# Patient Record
Sex: Female | Born: 1955 | Race: White | Hispanic: No | Marital: Married | Smoking: Never smoker
Health system: Southern US, Community
[De-identification: ages and names within clinical notes are randomized; demographics above are authoritative.]

## PROBLEM LIST (undated history)

## (undated) DIAGNOSIS — K449 Diaphragmatic hernia without obstruction or gangrene: Secondary | ICD-10-CM

## (undated) DIAGNOSIS — I10 Essential (primary) hypertension: Secondary | ICD-10-CM

## (undated) HISTORY — PX: REPLACEMENT TOTAL HIP W/  RESURFACING IMPLANTS: SUR1222

---

## 2009-07-20 ENCOUNTER — Ambulatory Visit (HOSPITAL_COMMUNITY): Admission: RE | Admit: 2009-07-20 | Discharge: 2009-07-20 | Payer: Self-pay | Admitting: Family Medicine

## 2009-08-12 ENCOUNTER — Ambulatory Visit (HOSPITAL_COMMUNITY): Admission: RE | Admit: 2009-08-12 | Discharge: 2009-08-12 | Payer: Self-pay | Admitting: Family Medicine

## 2010-01-26 ENCOUNTER — Ambulatory Visit (HOSPITAL_COMMUNITY): Admission: RE | Admit: 2010-01-26 | Discharge: 2010-01-26 | Payer: Self-pay | Admitting: Family Medicine

## 2010-07-27 ENCOUNTER — Ambulatory Visit (HOSPITAL_COMMUNITY)
Admission: RE | Admit: 2010-07-27 | Discharge: 2010-07-27 | Payer: Self-pay | Source: Home / Self Care | Attending: Family Medicine | Admitting: Family Medicine

## 2011-07-10 ENCOUNTER — Other Ambulatory Visit (HOSPITAL_COMMUNITY): Payer: Self-pay | Admitting: Family Medicine

## 2011-07-10 DIAGNOSIS — Z139 Encounter for screening, unspecified: Secondary | ICD-10-CM

## 2011-07-31 ENCOUNTER — Ambulatory Visit (HOSPITAL_COMMUNITY)
Admission: RE | Admit: 2011-07-31 | Discharge: 2011-07-31 | Disposition: A | Discharge status: Home / Self Care | Payer: 59 | Source: Ambulatory Visit | Attending: Family Medicine | Admitting: Family Medicine

## 2011-07-31 DIAGNOSIS — Z139 Encounter for screening, unspecified: Secondary | ICD-10-CM

## 2011-07-31 DIAGNOSIS — Z1231 Encounter for screening mammogram for malignant neoplasm of breast: Secondary | ICD-10-CM | POA: Insufficient documentation

## 2012-07-04 ENCOUNTER — Other Ambulatory Visit (HOSPITAL_COMMUNITY): Payer: Self-pay | Admitting: Family Medicine

## 2012-07-04 DIAGNOSIS — Z139 Encounter for screening, unspecified: Secondary | ICD-10-CM

## 2012-08-01 ENCOUNTER — Ambulatory Visit (HOSPITAL_COMMUNITY): Payer: 59

## 2012-08-12 ENCOUNTER — Ambulatory Visit (HOSPITAL_COMMUNITY)
Admission: RE | Admit: 2012-08-12 | Discharge: 2012-08-12 | Disposition: A | Discharge status: Home / Self Care | Payer: 59 | Source: Ambulatory Visit | Attending: Family Medicine | Admitting: Family Medicine

## 2012-08-12 DIAGNOSIS — Z1231 Encounter for screening mammogram for malignant neoplasm of breast: Secondary | ICD-10-CM | POA: Insufficient documentation

## 2012-08-12 DIAGNOSIS — Z139 Encounter for screening, unspecified: Secondary | ICD-10-CM

## 2013-07-15 ENCOUNTER — Other Ambulatory Visit (HOSPITAL_COMMUNITY): Payer: Self-pay | Admitting: Family Medicine

## 2013-07-15 DIAGNOSIS — Z139 Encounter for screening, unspecified: Secondary | ICD-10-CM

## 2013-08-15 ENCOUNTER — Ambulatory Visit (HOSPITAL_COMMUNITY)
Admission: RE | Admit: 2013-08-15 | Discharge: 2013-08-15 | Disposition: A | Discharge status: Home / Self Care | Payer: 59 | Source: Ambulatory Visit | Attending: Family Medicine | Admitting: Family Medicine

## 2013-08-15 DIAGNOSIS — Z139 Encounter for screening, unspecified: Secondary | ICD-10-CM

## 2013-08-15 DIAGNOSIS — Z1231 Encounter for screening mammogram for malignant neoplasm of breast: Secondary | ICD-10-CM | POA: Insufficient documentation

## 2014-07-21 ENCOUNTER — Other Ambulatory Visit (HOSPITAL_COMMUNITY): Payer: Self-pay | Admitting: Family Medicine

## 2014-07-21 DIAGNOSIS — Z1231 Encounter for screening mammogram for malignant neoplasm of breast: Secondary | ICD-10-CM

## 2014-08-17 ENCOUNTER — Ambulatory Visit (HOSPITAL_COMMUNITY)
Admission: RE | Admit: 2014-08-17 | Discharge: 2014-08-17 | Disposition: A | Discharge status: Home / Self Care | Payer: 59 | Source: Ambulatory Visit | Attending: Family Medicine | Admitting: Family Medicine

## 2014-08-17 DIAGNOSIS — R928 Other abnormal and inconclusive findings on diagnostic imaging of breast: Secondary | ICD-10-CM | POA: Diagnosis not present

## 2014-08-17 DIAGNOSIS — Z1231 Encounter for screening mammogram for malignant neoplasm of breast: Secondary | ICD-10-CM | POA: Insufficient documentation

## 2014-08-19 ENCOUNTER — Other Ambulatory Visit: Payer: Self-pay | Admitting: Family Medicine

## 2014-08-19 DIAGNOSIS — R928 Other abnormal and inconclusive findings on diagnostic imaging of breast: Secondary | ICD-10-CM

## 2014-09-08 ENCOUNTER — Encounter (HOSPITAL_COMMUNITY): Payer: 59

## 2014-09-15 ENCOUNTER — Ambulatory Visit (HOSPITAL_COMMUNITY)
Admission: RE | Admit: 2014-09-15 | Discharge: 2014-09-15 | Disposition: A | Discharge status: Home / Self Care | Payer: 59 | Source: Ambulatory Visit | Attending: Family Medicine | Admitting: Family Medicine

## 2014-09-15 DIAGNOSIS — R928 Other abnormal and inconclusive findings on diagnostic imaging of breast: Secondary | ICD-10-CM

## 2017-11-25 ENCOUNTER — Encounter (HOSPITAL_COMMUNITY): Payer: Self-pay | Admitting: Emergency Medicine

## 2017-11-25 ENCOUNTER — Emergency Department (HOSPITAL_COMMUNITY): Payer: BLUE CROSS/BLUE SHIELD

## 2017-11-25 ENCOUNTER — Emergency Department (HOSPITAL_COMMUNITY)
Admission: EM | Admit: 2017-11-25 | Discharge: 2017-11-25 | Disposition: A | Discharge status: Home / Self Care | Payer: BLUE CROSS/BLUE SHIELD | Attending: Emergency Medicine | Admitting: Emergency Medicine

## 2017-11-25 ENCOUNTER — Other Ambulatory Visit: Payer: Self-pay

## 2017-11-25 DIAGNOSIS — I10 Essential (primary) hypertension: Secondary | ICD-10-CM | POA: Insufficient documentation

## 2017-11-25 DIAGNOSIS — Z79899 Other long term (current) drug therapy: Secondary | ICD-10-CM | POA: Diagnosis not present

## 2017-11-25 DIAGNOSIS — R55 Syncope and collapse: Secondary | ICD-10-CM | POA: Insufficient documentation

## 2017-11-25 DIAGNOSIS — R079 Chest pain, unspecified: Secondary | ICD-10-CM | POA: Insufficient documentation

## 2017-11-25 DIAGNOSIS — E785 Hyperlipidemia, unspecified: Secondary | ICD-10-CM | POA: Insufficient documentation

## 2017-11-25 HISTORY — DX: Diaphragmatic hernia without obstruction or gangrene: K44.9

## 2017-11-25 HISTORY — DX: Essential (primary) hypertension: I10

## 2017-11-25 LAB — COMPREHENSIVE METABOLIC PANEL
ALBUMIN: 3.9 g/dL (ref 3.5–5.0)
ALT: 18 U/L (ref 14–54)
ANION GAP: 14 (ref 5–15)
AST: 24 U/L (ref 15–41)
Alkaline Phosphatase: 71 U/L (ref 38–126)
BUN: 21 mg/dL — ABNORMAL HIGH (ref 6–20)
CHLORIDE: 104 mmol/L (ref 101–111)
CO2: 19 mmol/L — AB (ref 22–32)
Calcium: 9.6 mg/dL (ref 8.9–10.3)
Creatinine, Ser: 1.15 mg/dL — ABNORMAL HIGH (ref 0.44–1.00)
GFR calc non Af Amer: 50 mL/min — ABNORMAL LOW (ref >60)
GFR, EST AFRICAN AMERICAN: 57 mL/min — AB (ref >60)
GLUCOSE: 144 mg/dL — AB (ref 65–99)
Potassium: 3.9 mmol/L (ref 3.5–5.1)
SODIUM: 137 mmol/L (ref 135–145)
Total Bilirubin: 0.7 mg/dL (ref 0.3–1.2)
Total Protein: 7.2 g/dL (ref 6.5–8.1)

## 2017-11-25 LAB — CBC
HCT: 38.3 % (ref 36.0–46.0)
HEMOGLOBIN: 12.9 g/dL (ref 12.0–15.0)
MCH: 32.7 pg (ref 26.0–34.0)
MCHC: 33.7 g/dL (ref 30.0–36.0)
MCV: 97 fL (ref 78.0–100.0)
PLATELETS: 258 10*3/uL (ref 150–400)
RBC: 3.95 MIL/uL (ref 3.87–5.11)
RDW: 13.2 % (ref 11.5–15.5)
WBC: 12.2 10*3/uL — AB (ref 4.0–10.5)

## 2017-11-25 LAB — URINALYSIS, ROUTINE W REFLEX MICROSCOPIC
Bilirubin Urine: NEGATIVE
Glucose, UA: NEGATIVE mg/dL
Hgb urine dipstick: NEGATIVE
Ketones, ur: 5 mg/dL — AB
LEUKOCYTES UA: NEGATIVE
NITRITE: NEGATIVE
PH: 5 (ref 5.0–8.0)
Protein, ur: NEGATIVE mg/dL
SPECIFIC GRAVITY, URINE: 1.023 (ref 1.005–1.030)

## 2017-11-25 LAB — I-STAT TROPONIN, ED
TROPONIN I, POC: 0 ng/mL (ref 0.00–0.08)
Troponin i, poc: 0 ng/mL (ref 0.00–0.08)

## 2017-11-25 LAB — LIPASE, BLOOD: Lipase: 42 U/L (ref 11–51)

## 2017-11-25 MED ORDER — IOPAMIDOL (ISOVUE-370) INJECTION 76%
100.0000 mL | Freq: Once | INTRAVENOUS | Status: AC | PRN
  Administered 2017-11-25: 100 mL via INTRAVENOUS

## 2017-11-25 MED ORDER — IOPAMIDOL (ISOVUE-370) INJECTION 76%
INTRAVENOUS | Status: AC
  Filled 2017-11-25: qty 100

## 2017-11-25 MED ORDER — SODIUM CHLORIDE 0.9 % IV BOLUS
1000.0000 mL | Freq: Once | INTRAVENOUS | Status: AC
  Administered 2017-11-25: 1000 mL via INTRAVENOUS

## 2017-11-25 MED ORDER — IOPAMIDOL (ISOVUE-370) INJECTION 76%: 100.0000 mL | Freq: Once | INTRAVENOUS | Status: DC | PRN

## 2017-11-25 NOTE — ED Notes (Signed)
Pt stated that she felt fine, during orthostatic vitals.

## 2017-11-25 NOTE — Discharge Instructions (Addendum)
You were seen in the emergency department today after passing out.  Your workup in the emergency department was reassuring.  The enzyme we used to check your heart, the EKG we did, and the imaging we did did not show any problems with your heart.  The CT we did also did not show a blood clot in your lungs.  Your blood sugar and your measure of kidney function were both somewhat elevated, these are things that can be rechecked by your primary care doctor.  We are unsure of exactly why you had your symptoms that you had today.  With this being said it is important that you follow-up with your primary care doctor in the next 2-3 days for reevaluation.  Return to the ER at any time for any new or worsening symptoms including but not limited to worsening pain, passing out again, trouble breathing, inability to keep fluids down, blood in your vomit, blood in your stool, or any other concerns that she may have.

## 2017-11-25 NOTE — ED Provider Notes (Signed)
[facility] EMERGENCY [facility]PARTMENT Provider Note   CSN: [id number] Arrival date & time: 11/25/17  1349     History   Chief Complaint Chief Complaint  Patient presents with  . Chest Pain    HPI Amy Trujillo is a 62 y.o. female with a history of hypertension, hyperlipidemia, and GERD who presents to the emergency department status post likely syncopal episode which occurred at 1430 today.  Patient states that she was in the car with her husband and her grandson when she started to feel hot/overheated, she turned the air conditioning on and laid her seat back.  Her husband reports that she then lost consciousness for a few seconds prior to sitting straight up.  Patient states when she awoke she felt tingly all over, a bit lightheaded/"funny feeling in the head" and had a pain in the epigastric/lower chest region which radiated to her back.  She also felt somewhat short of breath and nauseous.  Her chest/back discomfort has improved, states it is now just a tightness type sensation which is a 3/10 in severity- it did not have significant alleviating/aggravating factors.  She remains a bit nauseous. Her dyspnea has resolved.  Denies fever, chills, vomiting, leg pain/swelling, hemoptysis, recent surgery/trauma, recent long travel, hormone use, personal hx of cancer, or personal/family hx of DVT/PE.  No history of early CAD in her family.    HPI  Past Medical History:  Diagnosis Date  . Hiatal hernia   . Hypertension     There are no active problems to display for this patient.   Past Surgical History:  Procedure Laterality Date  . REPLACEMENT TOTAL HIP W/  RESURFACING IMPLANTS       OB History   None      Home Medications    Prior to Admission medications   Medication Sig Start Date End Date Taking? Authorizing Provider  cephALEXin (KEFLEX) 500 MG capsule Take 2,000 mg by mouth See admin instructions. Take 4 capsules (1000 mg) by mouth one hour prior to dental  appointments 10/19/17   [provider]  lisinopril (PRINIVIL,ZESTRIL) 20 MG tablet Take 20 mg by mouth daily. 09/14/17   [provider]  lisinopril-hydrochlorothiazide (PRINZIDE,ZESTORETIC) 20-12.5 MG tablet Take 1 tablet by mouth daily. 10/08/17   [provider]  omeprazole (PRILOSEC) 20 MG capsule Take 20 mg by mouth daily. 10/08/17   [provider]  simvastatin (ZOCOR) 20 MG tablet Take 20 mg by mouth daily. 10/08/17   [provider]    Family History History reviewed. No pertinent family history.  Social History Social History   Tobacco Use  . Smoking status: Never Smoker  . Smokeless tobacco: Never Used  Substance Use Topics  . Alcohol use: Never    Frequency: Never  . Drug use: Never     Allergies   Patient has no allergy information on record.   Review of Systems Review of Systems  Constitutional: Positive for diaphoresis ("feeling over heated" resolved). Negative for chills and fever.  Eyes: Negative for visual disturbance.  Respiratory: Positive for shortness of breath (resolved).   Cardiovascular: Positive for chest pain. Negative for leg swelling.  Gastrointestinal: Positive for abdominal pain (epigastric region) and nausea. Negative for blood in stool, constipation, diarrhea and vomiting.  Musculoskeletal: Positive for back pain.  Neurological: Positive for syncope and light-headedness. Negative for weakness and numbness.  All other systems reviewed and are negative.   Physical Exam Updated Vital Signs BP 124/65 (BP Location: Right Arm)  Pulse (!) 103   Temp 97.9 F (36.6 C) (Oral)   Resp (!) 26   SpO2 100%   Physical Exam  Constitutional: She appears well-developed and well-nourished.  Non-toxic appearance. No distress.  HENT:  Head: Normocephalic and atraumatic.  Nose: Nose normal.  Mouth/Throat: Uvula is midline and oropharynx is clear and moist.  Eyes: Pupils are equal, round, and reactive to light.  Conjunctivae and EOM are normal. Right eye exhibits no discharge. Left eye exhibits no discharge.  Neck: Normal range of motion. Neck supple. No spinous process tenderness present.  Cardiovascular: Normal rate and regular rhythm.  No murmur heard. Pulses:      Radial pulses are 2+ on the right side, and 2+ on the left side.  Pulmonary/Chest: Effort normal and breath sounds normal. No respiratory distress. She has no decreased breath sounds. She has no wheezes. She has no rhonchi. She has no rales.  Abdominal: Soft. She exhibits no distension. There is no tenderness.  Musculoskeletal: She exhibits edema (trace bilaterally). She exhibits no tenderness.  No midline tenderness to palpation.   Neurological: She is alert.  Clear speech.  No facial droop.  CN III-XII grossly intact.  Sensation grossly intact bilateral upper and lower extremities.  Patient has 5 out of 5 symmetric grip strength.  She has 5 out of 5 strength with plantar and dorsiflexion.  Normal finger to nose bilaterally.  Negative pronator drift.  Negative Romberg sign.  Patient states she feels a bit lightheaded and unsteady with gait.  Skin: Skin is warm and dry. No rash noted.  Psychiatric: She has a normal mood and affect. Her behavior is normal.  Nursing note and vitals reviewed.   ED Treatments / Results  Labs Results for orders placed or performed during the hospital encounter of 11/25/17  CBC  Result Value Ref Range   WBC 12.2 (H) 4.0 - 10.5 K/uL   RBC 3.95 3.87 - 5.11 MIL/uL   Hemoglobin 12.9 12.0 - 15.0 g/dL   HCT 16.1 09.6 - 04.5 %   MCV 97.0 78.0 - 100.0 fL   MCH 32.7 26.0 - 34.0 pg   MCHC 33.7 30.0 - 36.0 g/dL   RDW 40.9 81.1 - 91.4 %   Platelets 258 150 - 400 K/uL  Lipase, blood  Result Value Ref Range   Lipase 42 11 - 51 U/L  Comprehensive metabolic panel  Result Value Ref Range   Sodium 137 135 - 145 mmol/L   Potassium 3.9 3.5 - 5.1 mmol/L   Chloride 104 101 - 111 mmol/L   CO2 19 (L) 22 - 32 mmol/L    Glucose, Bld 144 (H) 65 - 99 mg/dL   BUN 21 (H) 6 - 20 mg/dL   Creatinine, Ser 7.82 (H) 0.44 - 1.00 mg/dL   Calcium 9.6 8.9 - 95.6 mg/dL   Total Protein 7.2 6.5 - 8.1 g/dL   Albumin 3.9 3.5 - 5.0 g/dL   AST 24 15 - 41 U/L   ALT 18 14 - 54 U/L   Alkaline Phosphatase 71 38 - 126 U/L   Total Bilirubin 0.7 0.3 - 1.2 mg/dL   GFR calc non Af Amer 50 (L) >60 mL/min   GFR calc Af Amer 57 (L) >60 mL/min   Anion gap 14 5 - 15  I-stat troponin, ED  Result Value Ref Range   Troponin i, poc 0.00 0.00 - 0.08 ng/mL   Comment 3           EKG EKG  Interpretation  Date/Time:  Sunday November 25 2017 13:56:51 EDT Ventricular Rate:  90 PR Interval:  130 QRS Duration: 70 QT Interval:  362 QTC Calculation: 442 R Axis:   80 Text Interpretation:  Normal sinus rhythm Normal ECG No old tracing to compare Confirmed by Jacalyn Lefevre [phone]) on 11/25/2017 3:36:19 PM   Radiology Dg Chest 2 View  Result Date: 11/25/2017 CLINICAL DATA:  Chest pain, LEFT shoulder pain, shortness of breath, syncope, hypertension EXAM: CHEST - 2 VIEW COMPARISON:  None FINDINGS: Normal heart size and pulmonary vascularity. Large hiatal hernia with air-fluid level. Mediastinal contours otherwise normal. Lungs clear. No acute infiltrate, pleural effusion or pneumothorax. Bones demineralized with scattered endplate spur formation at thoracolumbar junction. IMPRESSION: Large hiatal hernia with air-fluid level. Otherwise negative exam. Electronically Signed   By: Ulyses Southward M.D.   On: 11/25/2017 15:06    Procedures Procedures (including critical care time)  Medications Ordered in ED Medications - No data to display   Initial Impression / Assessment and Plan / ED Course  I have reviewed the triage vital signs and the nursing notes.  Pertinent labs & imaging results that were available during my care of the patient were reviewed by me and considered in my medical decision making (see chart for details).   Patient presents status  post likely syncopal episode.  Initial vital signs notable for tachycardia and tachypnea-these normalized with repeat vitals and on my exam.  Patient with fairly benign physical exam.  Triage workup reviewed.  Initial troponin within normal limits, EKG with normal sinus rhythm. No anemia. Patient has nonspecific leukocytosis at 12.2.  Creatinine 1.15, she states this is about what it normally runs.  Hyperglycemia at 144.  No significant electrolyte abnormalities.  UA does not appear infected.  Chest x-ray with hiatal hernia which patient is aware of, no active cardiopulmonary disease.  Given likely syncope and subsequent chest discomfort and dyspnea there is some concern for pulmonary embolism.  Will evaluate with CT angio of the chest as well as a repeat troponin.  Will obtain orthostatic vital signs.  Will give 1 L of fluids.  Repeat troponin negative, EKG without obvious ischemia, heart score 2 indicating low risk, do not suspect ACS at this time.  CT angio negative for pulmonary embolism, doubt PE.  Patient's discomfort is not a tearing sensation, she has 2+ symmetric radial pulses, no widening of the mediastinum on imaging, doubt dissection.  Orthostatic vitals are reassuring, however these were obtained after 1 L of fluids. Cardiac monitor reviewed throughout patients ED visit- no arrhthymias.  Unclear definitive etiology to patient's syncopal episode and symptoms.  On reevaluation patient is feeling somewhat improved, she is hemodynamically stable and appears safe for discharge.  I discussed results, need for PCP follow-up, and return precautions with the patient. Provided opportunity for questions, patient confirmed understanding and is in agreement with plan.   Findings and plan of care discussed with supervising physician Dr. Particia Nearing  who is in agreement with plan.   Vitals:   11/25/17 1715 11/25/17 1915  BP: (!) 108/55 115/69  Pulse: 90 87  Resp: 16 13  Temp:    SpO2: 100% 100%    Final  Clinical Impressions(s) / ED Diagnoses   Final diagnoses:  Syncope, unspecified syncope type    ED Discharge Orders    None       Cherly Anderson, PA-C 11/26/17 0145    Jacalyn Lefevre, MD 11/29/17 [phone]

## 2017-11-25 NOTE — ED Triage Notes (Signed)
Patient presents to ED after patient was riding back from a trip this morning, and she told her husband she was having a hot flash.  Patient's husband looked over a few minutes after and patient was completely pale, not responding to him.  Patient then gasped a large breath of air, opened her eyes, and was confused at first.  Patient now oriented, experiencing right upper abdominal pain, central chest pain, back pain, nausea, denies diarrhea, denies changes in urination, still has gallbladder.  Hx of HTN.

## 2019-12-20 IMAGING — DX DG CHEST 2V
2 series · 2 of 2 positions shown · non-contrast
Comparison: None

CLINICAL DATA: Chest pain, LEFT shoulder pain, shortness of breath,
syncope, hypertension

EXAM:
CHEST - 2 VIEW

[chest pa]
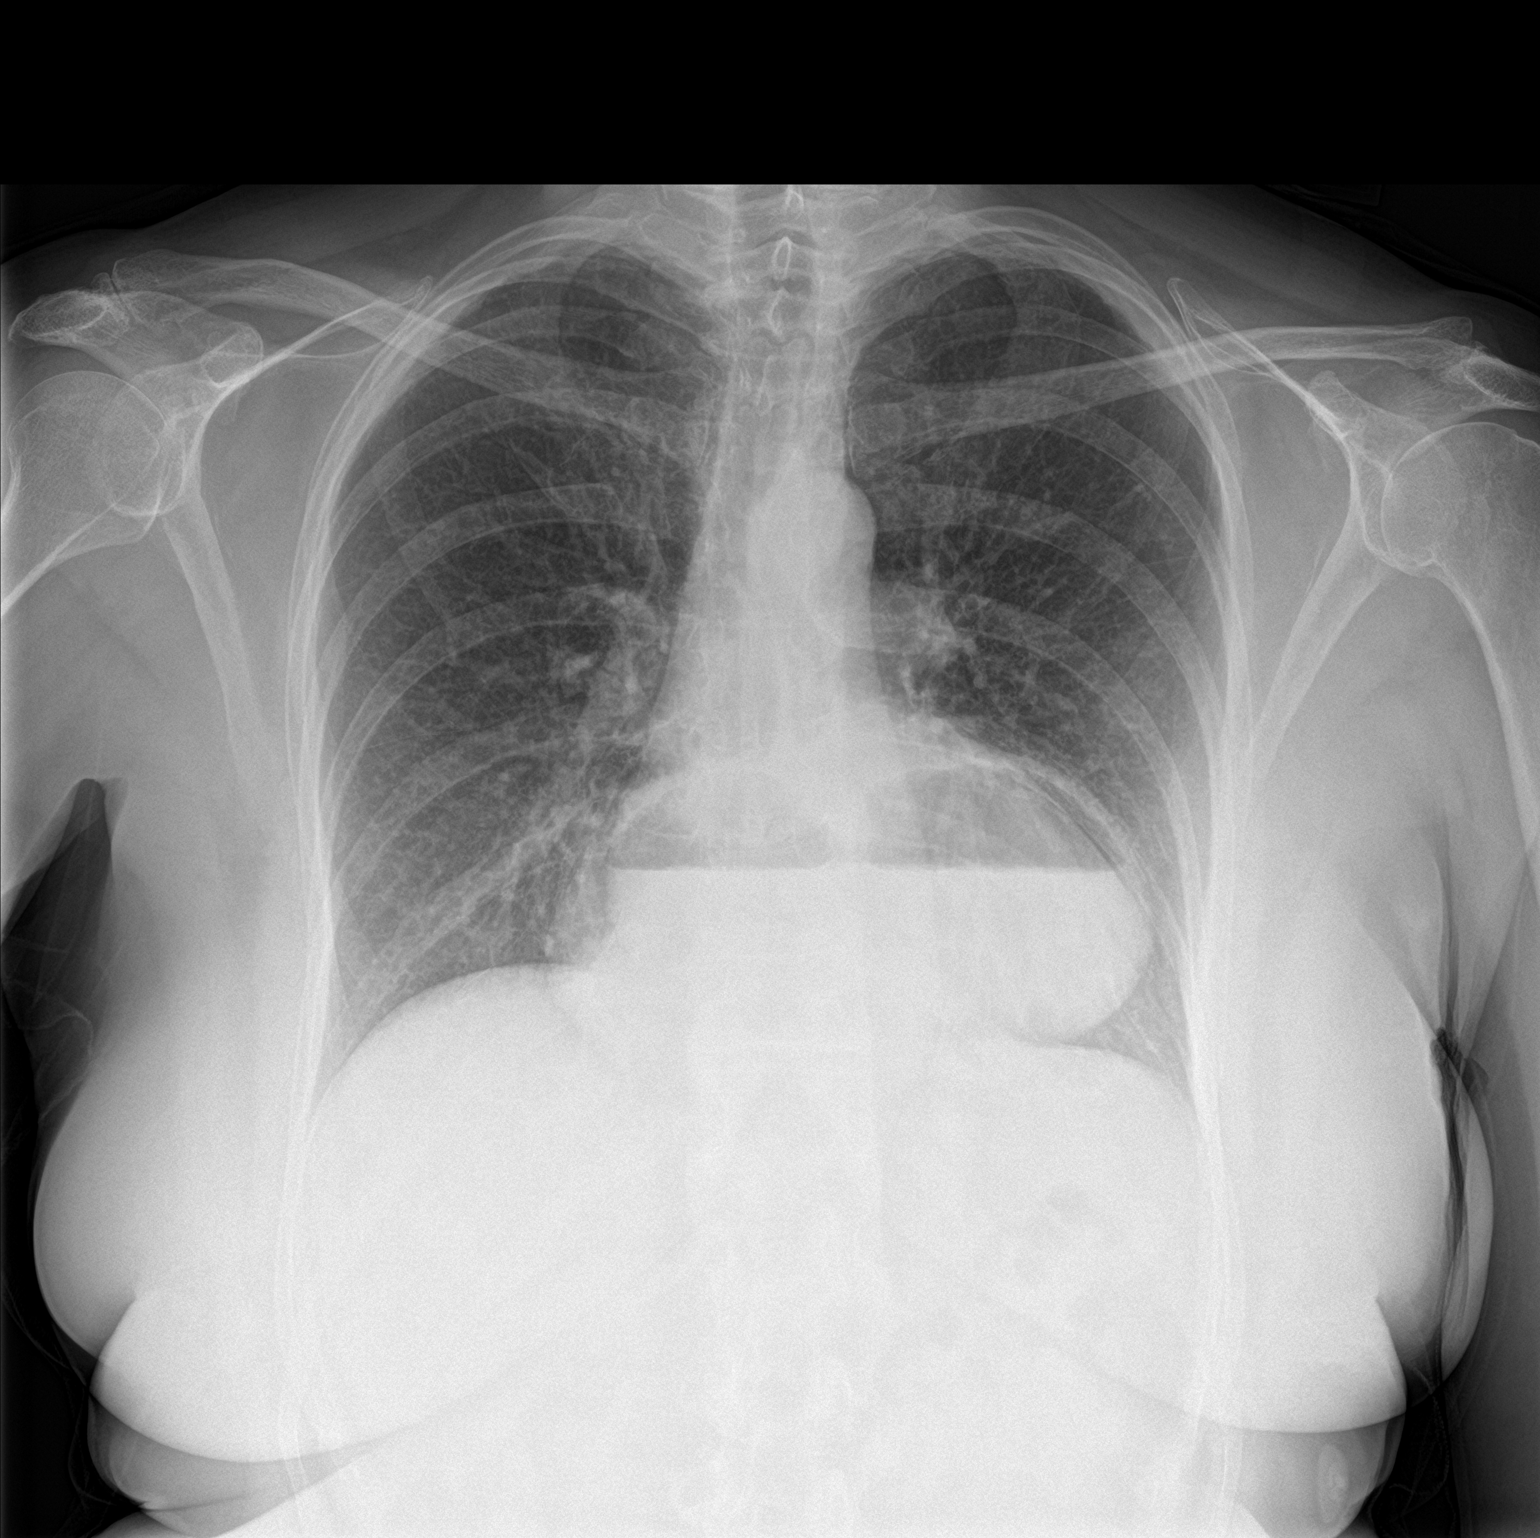

[chest lat]
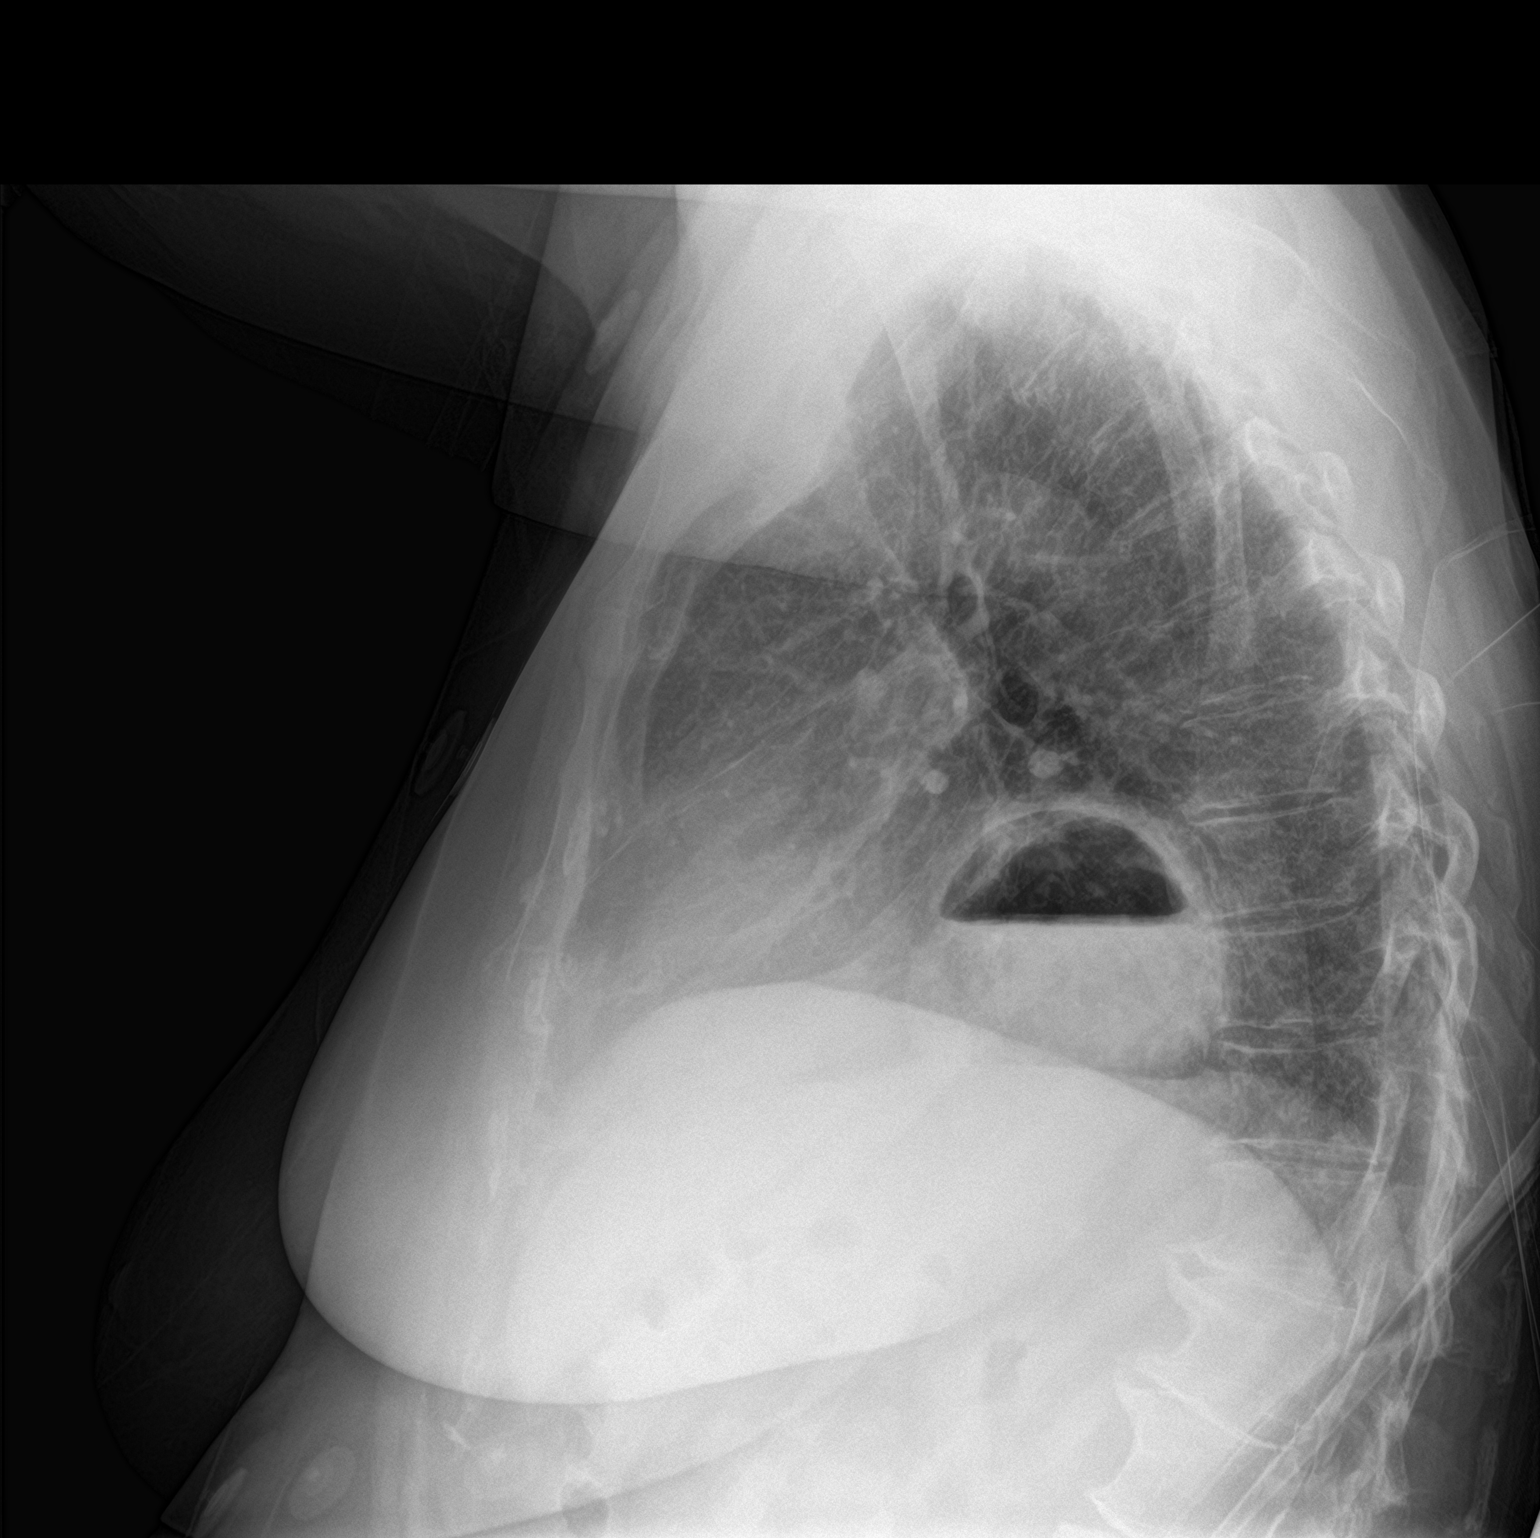

[2 of 2 positions shown; findings below may reference images not displayed]

FINDINGS: Normal heart size and pulmonary vascularity.

Large hiatal hernia with air-fluid level.

Mediastinal contours otherwise normal.

Lungs clear.

No acute infiltrate, pleural effusion or pneumothorax.

Bones demineralized with scattered endplate spur formation at
thoracolumbar junction.
IMPRESSION: Large hiatal hernia with air-fluid level.

Otherwise negative exam.

## 2019-12-20 IMAGING — CT CT ANGIO CHEST
2 of 9 series · 19 of 46 positions shown · IV contrast (iopamidol)
Comparison: Chest radiographs dated 11/25/2017

CLINICAL DATA: Syncope, central chest pain, back pain

EXAM:
CT ANGIOGRAPHY CHEST WITH CONTRAST
TECHNIQUE: Multidetector CT imaging of the chest was performed using the
standard protocol during bolus administration of intravenous
contrast. Multiplanar CT image reconstructions and MIPs were
obtained to evaluate the vascular anatomy.
CONTRAST:  100mL DDN2A6-V5H IOPAMIDOL (DDN2A6-V5H) INJECTION 76%

[Series 6: thins · axial · 0.74mm/px · z∈[+1140,+1407]mm · 16 of 301 slices shown]
[im 17/301  lung]
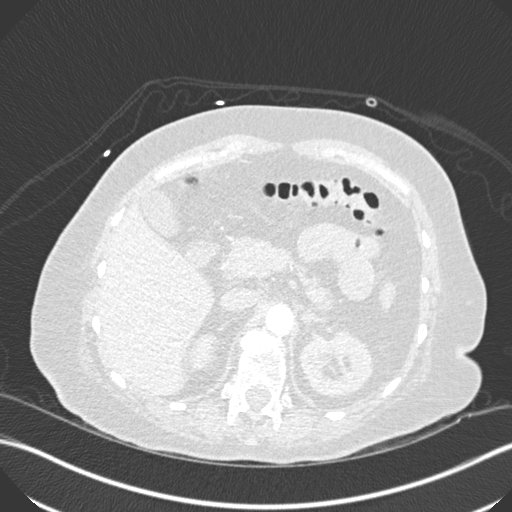
[im 34/301  soft-tissue]
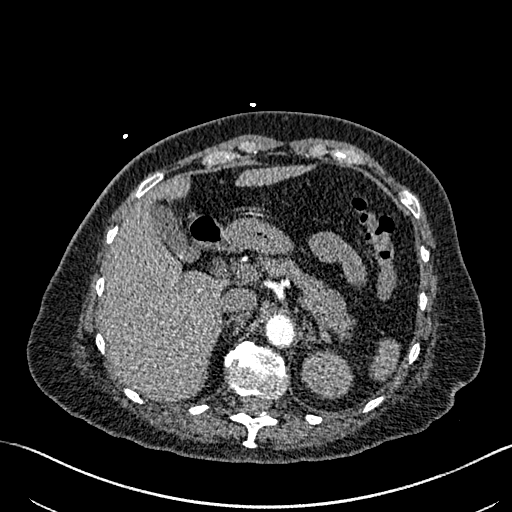
[im 51/301  lung]
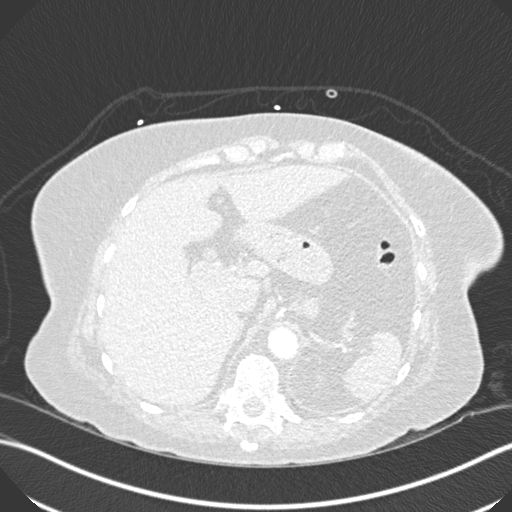
[im 67/301  soft-tissue]
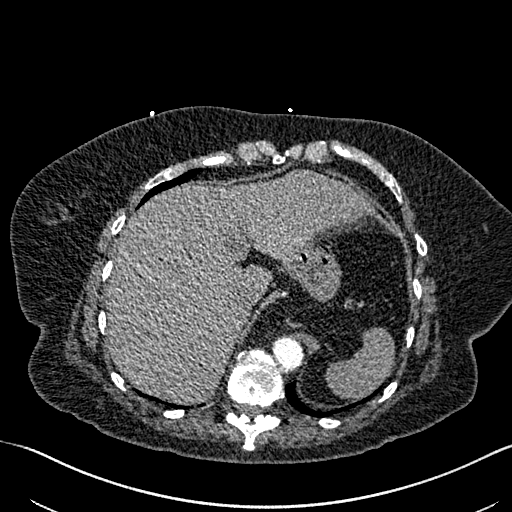
[im 84/301  lung]
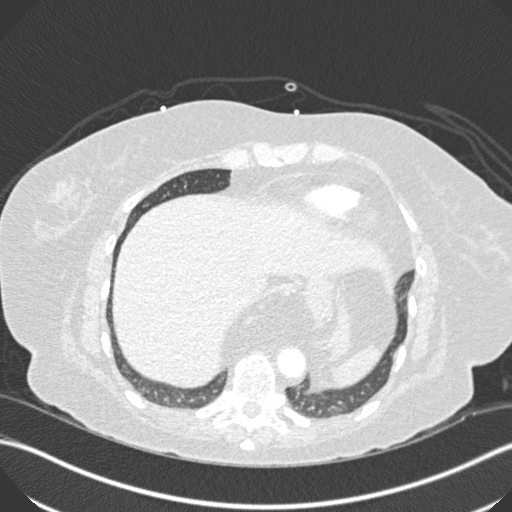
[im 101/301  soft-tissue]
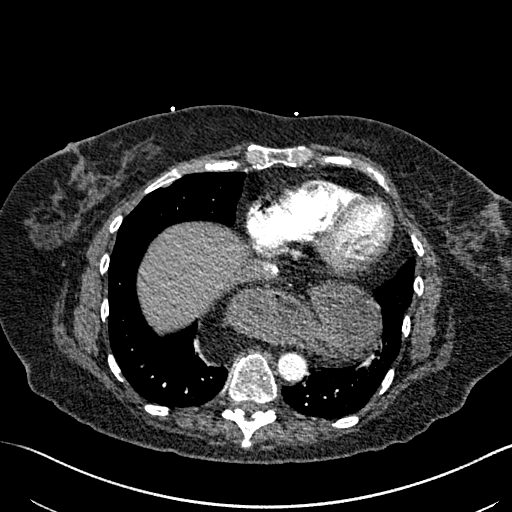
[im 117/301  lung]
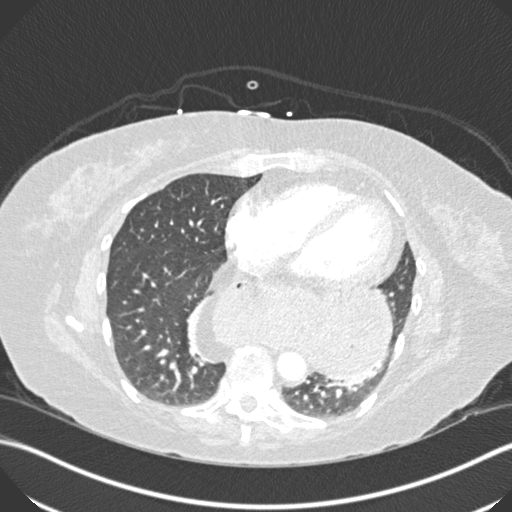
[im 134/301  soft-tissue]
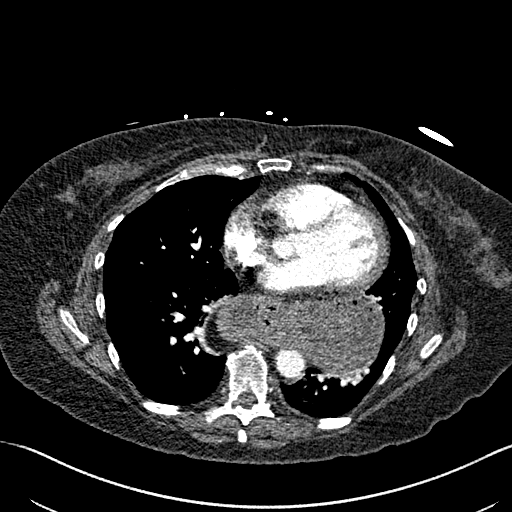
[im 167/301  lung]
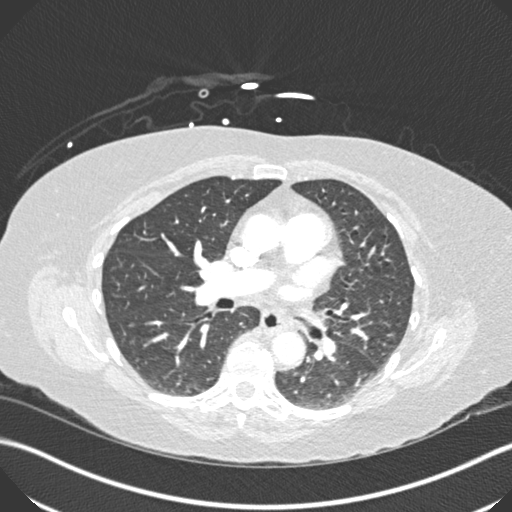
[im 184/301  soft-tissue]
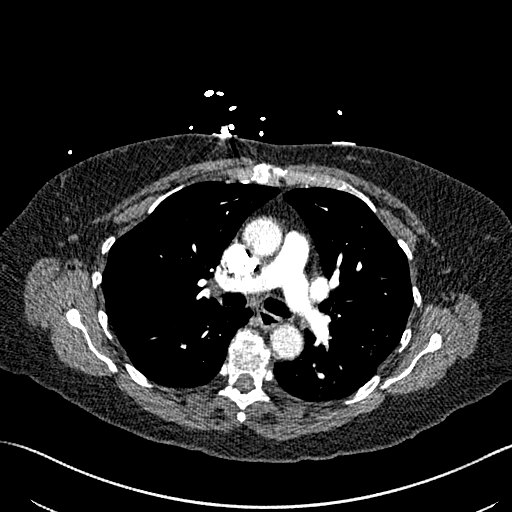
[im 201/301  lung]
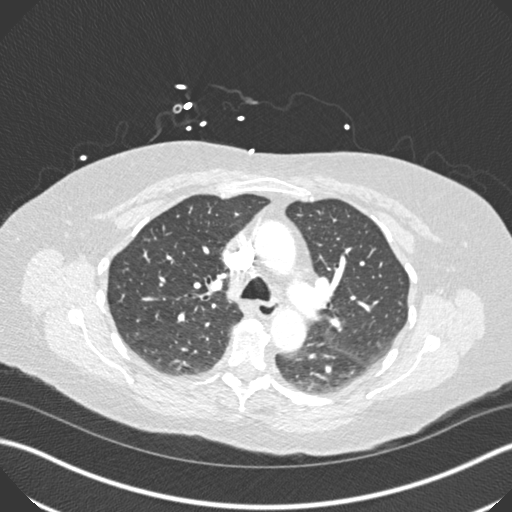
[im 217/301  soft-tissue]
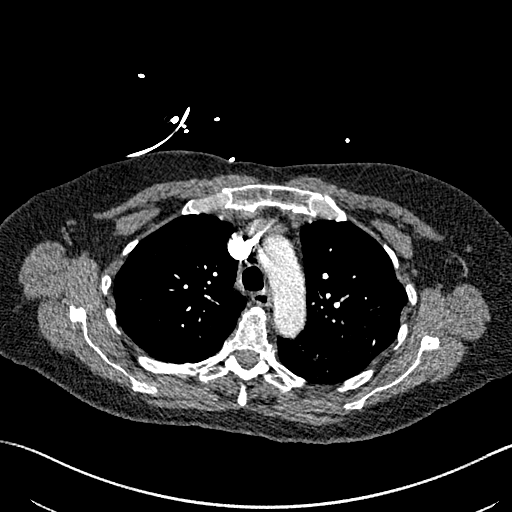
[im 234/301  lung]
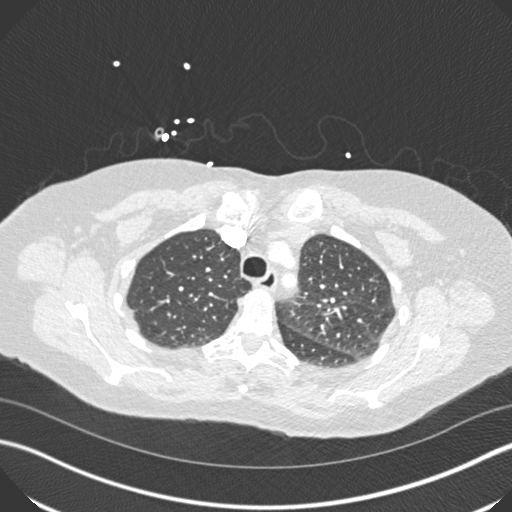
[im 251/301  soft-tissue]
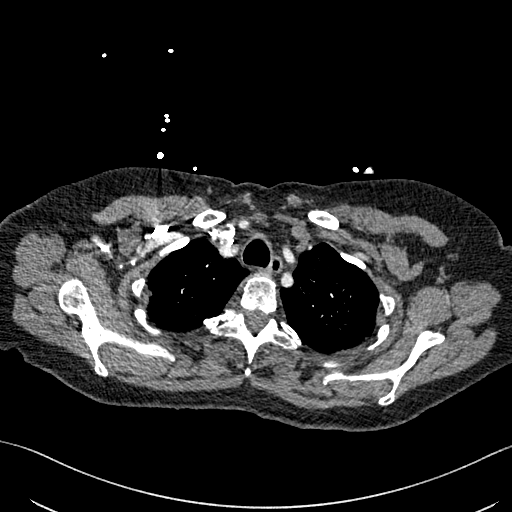
[im 267/301  lung]
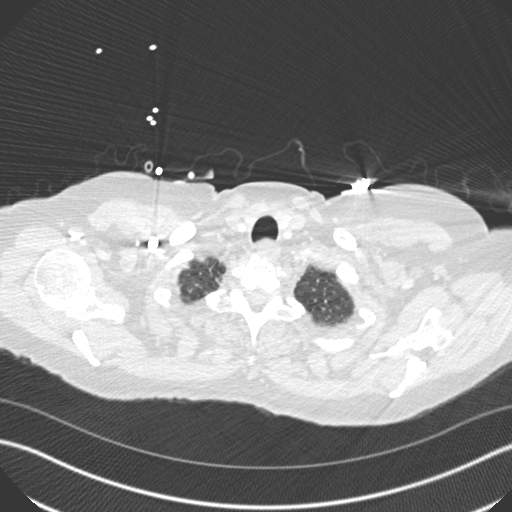
[im 284/301  soft-tissue]
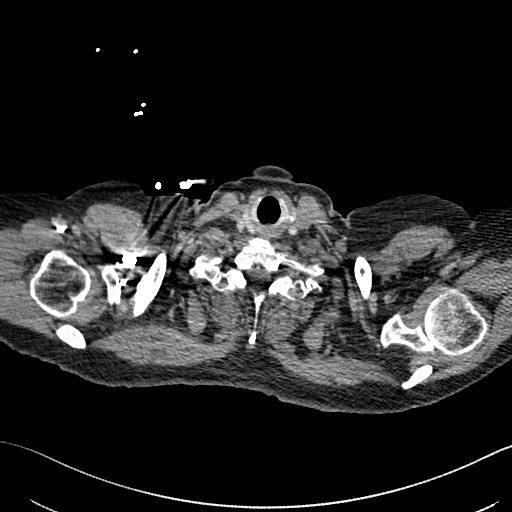

[Series 8: coronal mpr · coronal · 0.59mm/px · 3 of 151 slices shown]
[im 38/151  soft-tissue]
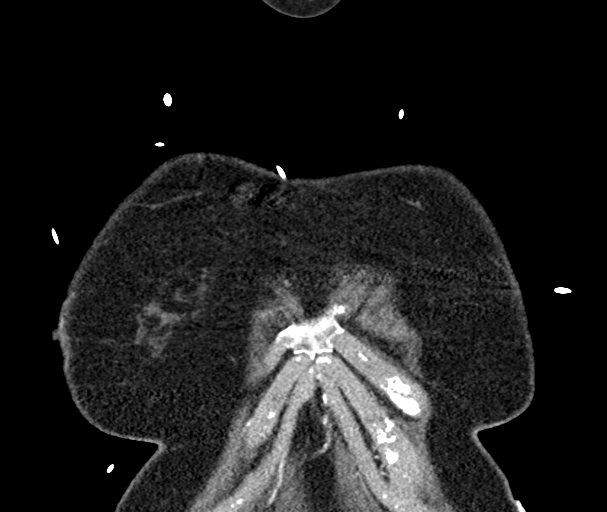
[im 76/151  soft-tissue]
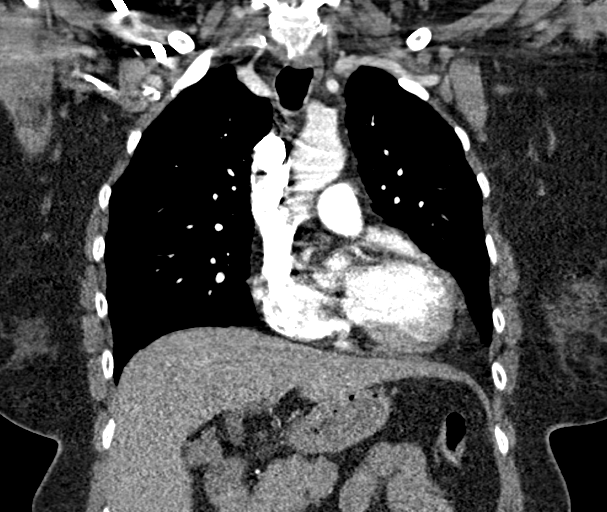
[im 113/151  soft-tissue]
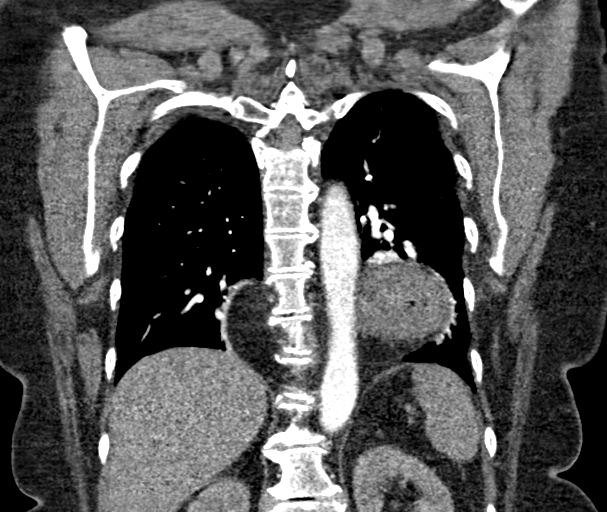

[19 of 46 positions shown; findings below may reference images not displayed]

FINDINGS: Cardiovascular: Preferential opacification of the pulmonary
arteries. No evidence of pulmonary embolism to the segmental level.

The heart is normal in size.  No pericardial effusion.

No evidence of thoracic aortic aneurysm.

Mediastinum/Nodes: No suspicious mediastinal lymphadenopathy.

Visualized thyroid is unremarkable.

Lungs/Pleura: Lungs are clear.

Mild compressive atelectasis in the bilateral lower lobes.

No focal consolidation.

No pleural effusion or pneumothorax.

Upper Abdomen: Visualized upper abdomen is notable for a 3 mm
layering gallstone (series 5/image 90) and a large hiatal
hernia/intrathoracic stomach.

Musculoskeletal: Mild degenerative changes of the lower thoracic
spine.

Review of the MIP images confirms the above findings.
IMPRESSION: No evidence of pulmonary embolism.

No evidence of acute cardiopulmonary disease.
